# Patient Record
Sex: Female | Born: 1965 | Race: Asian | Hispanic: No
Health system: Southern US, Community
[De-identification: ages and names within clinical notes are randomized; demographics above are authoritative.]

---

## 1999-04-11 ENCOUNTER — Other Ambulatory Visit: Admission: RE | Admit: 1999-04-11 | Discharge: 1999-04-11 | Payer: Self-pay | Admitting: Family Medicine

## 2003-01-25 ENCOUNTER — Other Ambulatory Visit: Admission: RE | Admit: 2003-01-25 | Discharge: 2003-01-25 | Payer: Self-pay | Admitting: Obstetrics and Gynecology

## 2003-01-27 ENCOUNTER — Encounter: Payer: Self-pay | Admitting: Emergency Medicine

## 2003-01-27 ENCOUNTER — Inpatient Hospital Stay (HOSPITAL_COMMUNITY): Admission: AD | Admit: 2003-01-27 | Discharge: 2003-01-27 | Payer: Self-pay | Admitting: Obstetrics and Gynecology

## 2004-10-18 ENCOUNTER — Inpatient Hospital Stay (HOSPITAL_COMMUNITY): Admission: RE | Admit: 2004-10-18 | Discharge: 2004-10-23 | Payer: Self-pay | Admitting: Obstetrics

## 2013-11-05 ENCOUNTER — Ambulatory Visit
Admission: RE | Admit: 2013-11-05 | Discharge: 2013-11-05 | Disposition: A | Discharge status: Home / Self Care | Payer: BC Managed Care – PPO | Source: Ambulatory Visit | Attending: Family Medicine | Admitting: Family Medicine

## 2013-11-05 ENCOUNTER — Other Ambulatory Visit: Payer: Self-pay | Admitting: Family Medicine

## 2013-11-05 DIAGNOSIS — R52 Pain, unspecified: Secondary | ICD-10-CM

## 2015-06-04 ENCOUNTER — Other Ambulatory Visit: Payer: Self-pay | Admitting: Family Medicine

## 2015-06-04 ENCOUNTER — Ambulatory Visit
Admission: RE | Admit: 2015-06-04 | Discharge: 2015-06-04 | Disposition: A | Discharge status: Home / Self Care | Payer: BLUE CROSS/BLUE SHIELD | Source: Ambulatory Visit | Attending: Family Medicine | Admitting: Family Medicine

## 2015-06-04 DIAGNOSIS — M5489 Other dorsalgia: Secondary | ICD-10-CM

## 2016-04-01 ENCOUNTER — Ambulatory Visit
Admission: RE | Admit: 2016-04-01 | Discharge: 2016-04-01 | Disposition: A | Discharge status: Home / Self Care | Payer: BLUE CROSS/BLUE SHIELD | Source: Ambulatory Visit | Attending: Family Medicine | Admitting: Family Medicine

## 2016-04-01 ENCOUNTER — Other Ambulatory Visit: Payer: Self-pay | Admitting: Family Medicine

## 2016-04-01 DIAGNOSIS — L708 Other acne: Secondary | ICD-10-CM

## 2017-04-13 IMAGING — CR DG CHEST 2V
2 series · 2 of 2 positions shown · non-contrast
Comparison: PA and lateral chest x-ray November 05, 2013

CLINICAL DATA: No current chest complaints, history of acne.
Nonsmoker.

EXAM:
CHEST  2 VIEW

[w chest pa]
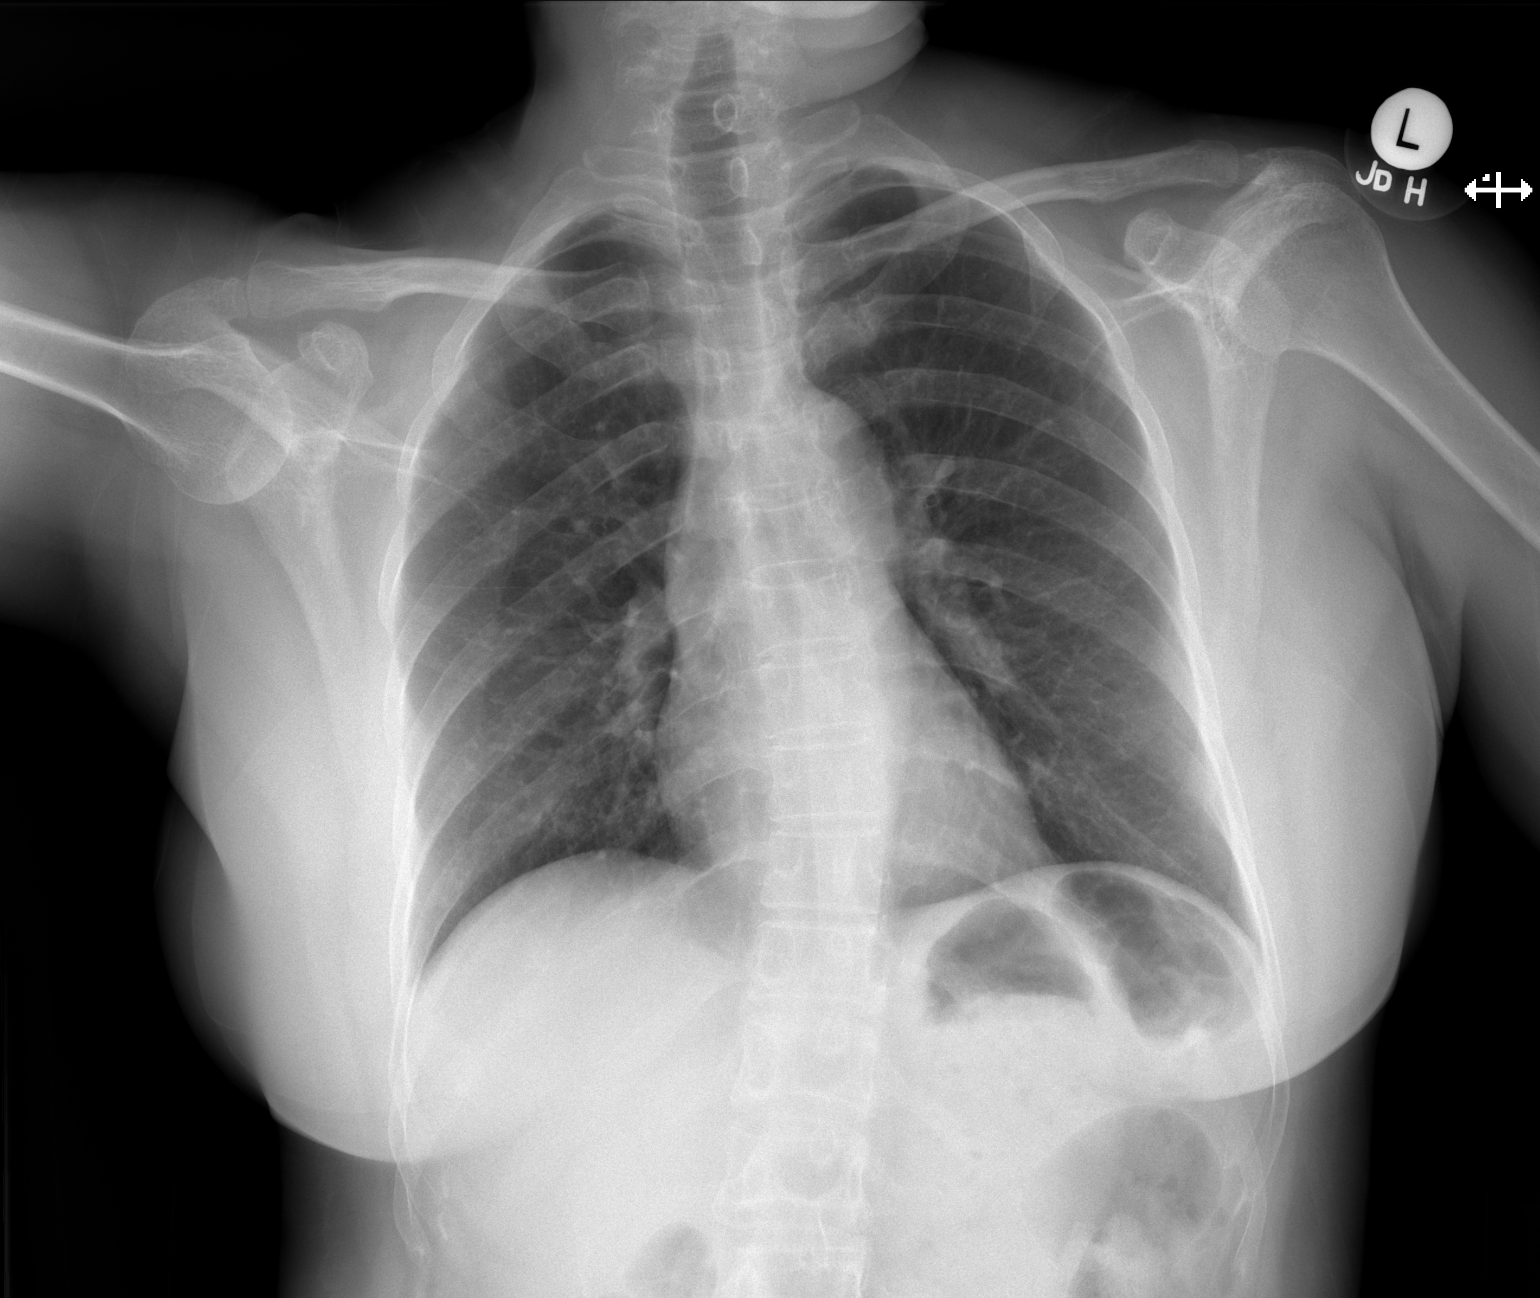

[w chest lat]
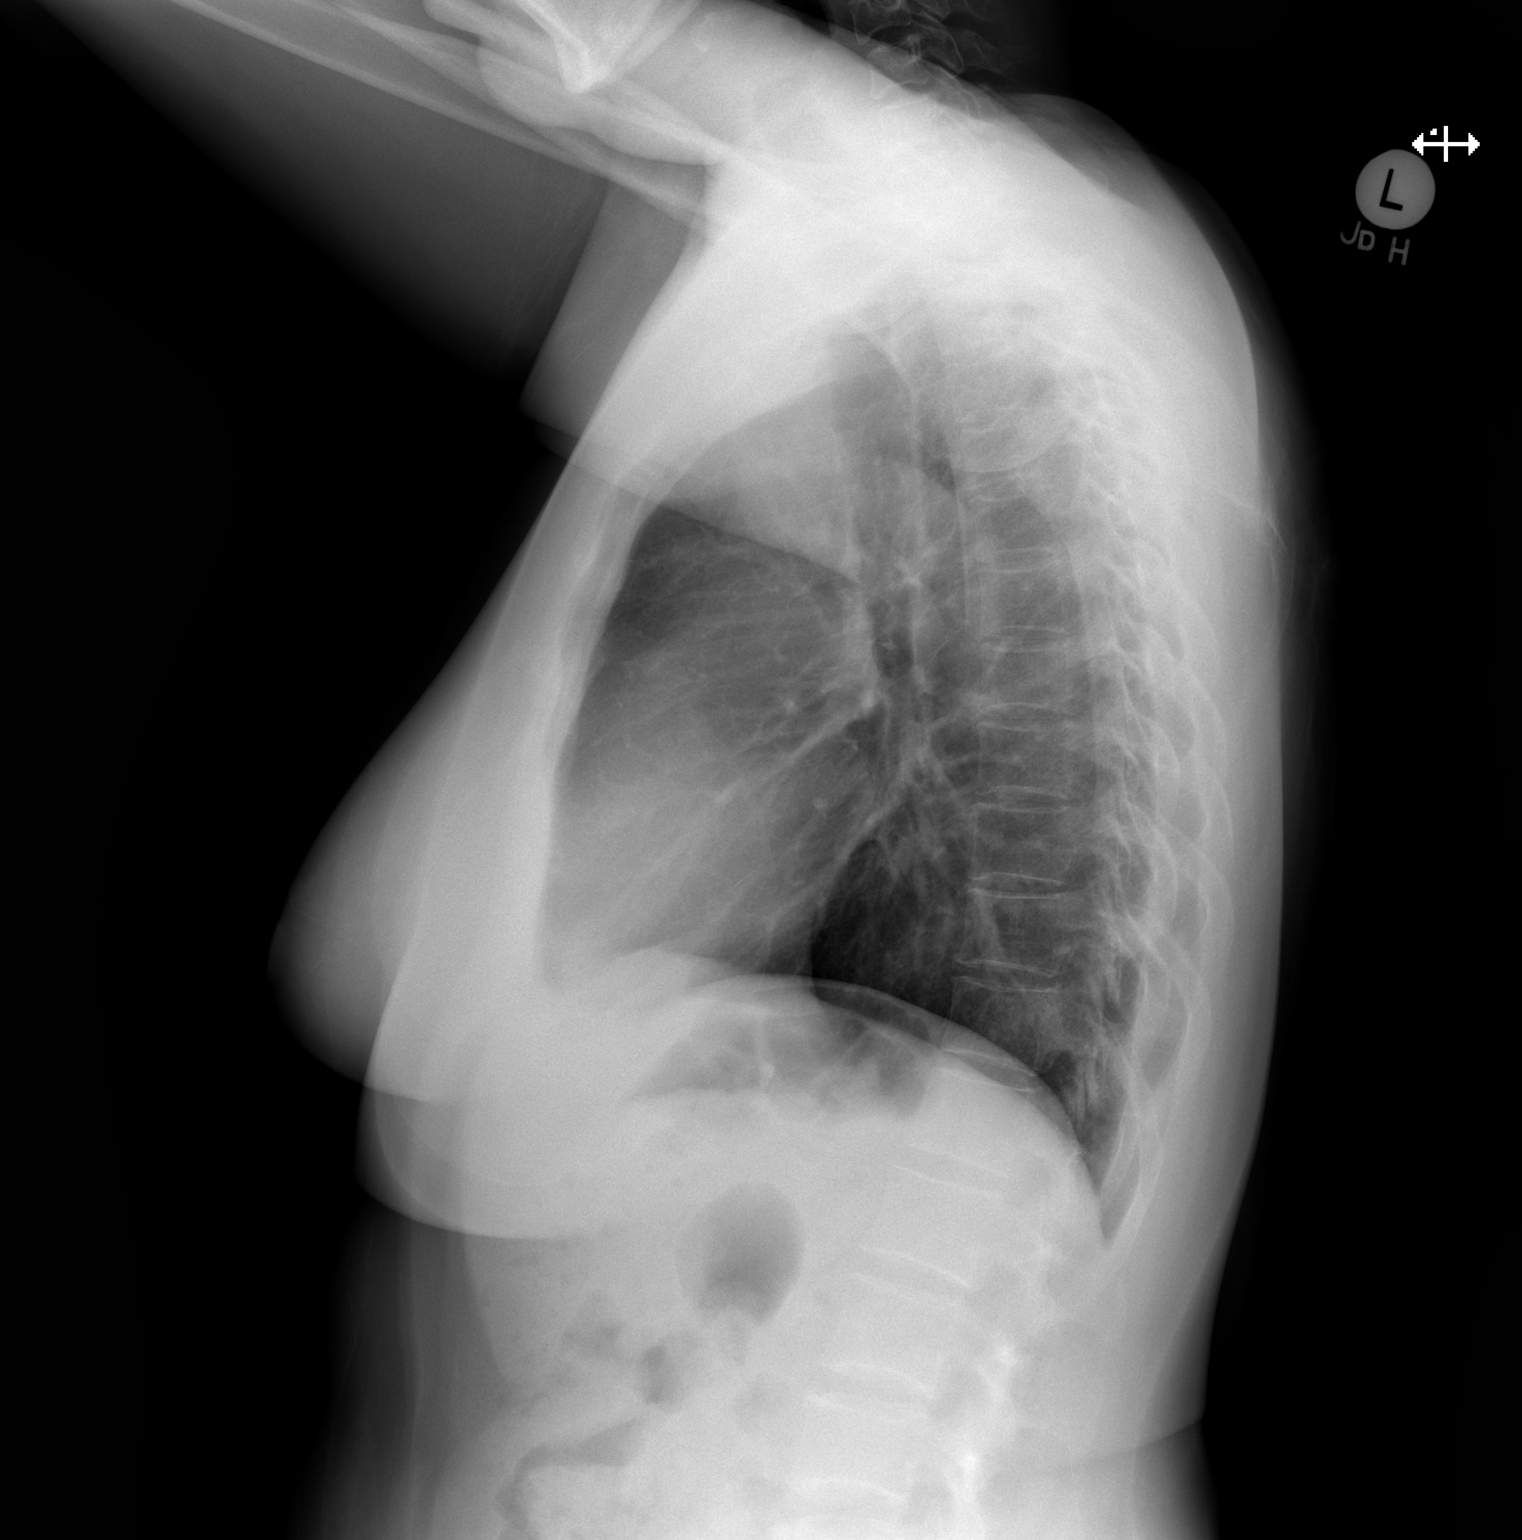

[2 of 2 positions shown; findings below may reference images not displayed]

FINDINGS: There is reverse S shaped thoracolumbar curvature slightly more
conspicuous on today's study. The lungs are well-expanded and clear.
The heart and pulmonary vascularity are normal. The mediastinum is
normal in width. There is no pleural effusion. The thoracic
vertebral bodies are preserved in height.
IMPRESSION: There is no active cardiopulmonary disease. Probable mild reverse S
shaped thoracolumbar scoliosis.

## 2022-03-30 ENCOUNTER — Other Ambulatory Visit: Payer: Self-pay | Admitting: Family Medicine

## 2022-03-30 DIAGNOSIS — M545 Low back pain, unspecified: Secondary | ICD-10-CM

## 2022-03-30 DIAGNOSIS — M5416 Radiculopathy, lumbar region: Secondary | ICD-10-CM

## 2022-04-14 ENCOUNTER — Other Ambulatory Visit: Payer: BLUE CROSS/BLUE SHIELD
